# Patient Record
Sex: Male | Born: 1985 | Race: White | Hispanic: No | Marital: Married | Smoking: Never smoker
Health system: Southern US, Community
[De-identification: ages and names within clinical notes are randomized; demographics above are authoritative.]

## PROBLEM LIST (undated history)

## (undated) DIAGNOSIS — N200 Calculus of kidney: Secondary | ICD-10-CM

---

## 2006-05-17 ENCOUNTER — Emergency Department: Payer: Self-pay | Admitting: Emergency Medicine

## 2006-05-20 ENCOUNTER — Emergency Department: Payer: Self-pay | Admitting: Emergency Medicine

## 2006-05-30 ENCOUNTER — Inpatient Hospital Stay: Payer: Self-pay | Admitting: Urology

## 2006-05-30 ENCOUNTER — Ambulatory Visit: Payer: Self-pay | Admitting: Urology

## 2011-04-11 ENCOUNTER — Emergency Department: Payer: Self-pay | Admitting: Emergency Medicine

## 2011-05-24 ENCOUNTER — Emergency Department: Payer: Self-pay | Admitting: Unknown Physician Specialty

## 2015-06-01 ENCOUNTER — Emergency Department
Admission: EM | Admit: 2015-06-01 | Discharge: 2015-06-01 | Disposition: A | Discharge status: Home / Self Care | Payer: BLUE CROSS/BLUE SHIELD | Attending: Emergency Medicine | Admitting: Emergency Medicine

## 2015-06-01 ENCOUNTER — Emergency Department: Payer: BLUE CROSS/BLUE SHIELD

## 2015-06-01 DIAGNOSIS — R109 Unspecified abdominal pain: Secondary | ICD-10-CM | POA: Diagnosis not present

## 2015-06-01 DIAGNOSIS — Z87442 Personal history of urinary calculi: Secondary | ICD-10-CM | POA: Insufficient documentation

## 2015-06-01 HISTORY — DX: Calculus of kidney: N20.0

## 2015-06-01 LAB — COMPREHENSIVE METABOLIC PANEL
ALT: 22 U/L (ref 17–63)
AST: 22 U/L (ref 15–41)
Albumin: 4.8 g/dL (ref 3.5–5.0)
Alkaline Phosphatase: 46 U/L (ref 38–126)
Anion gap: 7 (ref 5–15)
BUN: 21 mg/dL — ABNORMAL HIGH (ref 6–20)
CALCIUM: 9.2 mg/dL (ref 8.9–10.3)
CO2: 28 mmol/L (ref 22–32)
Chloride: 103 mmol/L (ref 101–111)
Creatinine, Ser: 0.92 mg/dL (ref 0.61–1.24)
GFR calc Af Amer: >60 mL/min (ref >60)
GFR calc non Af Amer: >60 mL/min (ref >60)
Glucose, Bld: 135 mg/dL — ABNORMAL HIGH (ref 65–99)
POTASSIUM: 4.1 mmol/L (ref 3.5–5.1)
SODIUM: 138 mmol/L (ref 135–145)
Total Bilirubin: 1.5 mg/dL — ABNORMAL HIGH (ref 0.3–1.2)
Total Protein: 7.4 g/dL (ref 6.5–8.1)

## 2015-06-01 LAB — URINALYSIS COMPLETE WITH MICROSCOPIC (ARMC ONLY)
BACTERIA UA: NONE SEEN
Bilirubin Urine: NEGATIVE
GLUCOSE, UA: NEGATIVE mg/dL
Hgb urine dipstick: NEGATIVE
Ketones, ur: NEGATIVE mg/dL
Leukocytes, UA: NEGATIVE
Nitrite: NEGATIVE
PROTEIN: NEGATIVE mg/dL
Specific Gravity, Urine: 1.003 — ABNORMAL LOW (ref 1.005–1.030)
Squamous Epithelial / LPF: NONE SEEN
pH: 7 (ref 5.0–8.0)

## 2015-06-01 LAB — CBC
HCT: 43.3 % (ref 40.0–52.0)
Hemoglobin: 14.7 g/dL (ref 13.0–18.0)
MCH: 30.7 pg (ref 26.0–34.0)
MCHC: 34.1 g/dL (ref 32.0–36.0)
MCV: 90.1 fL (ref 80.0–100.0)
Platelets: 215 10*3/uL (ref 150–440)
RBC: 4.8 MIL/uL (ref 4.40–5.90)
RDW: 12.4 % (ref 11.5–14.5)
WBC: 5.4 10*3/uL (ref 3.8–10.6)

## 2015-06-01 MED ORDER — ONDANSETRON HCL 4 MG/2ML IJ SOLN
INTRAMUSCULAR | Status: AC
  Filled 2015-06-01: qty 2

## 2015-06-01 MED ORDER — KETOROLAC TROMETHAMINE 30 MG/ML IJ SOLN
30.0000 mg | Freq: Once | INTRAMUSCULAR | Status: AC
  Administered 2015-06-01: 30 mg via INTRAVENOUS

## 2015-06-01 MED ORDER — FENTANYL CITRATE (PF) 100 MCG/2ML IJ SOLN
75.0000 ug | Freq: Once | INTRAMUSCULAR | Status: AC
  Administered 2015-06-01: 75 ug via INTRAVENOUS

## 2015-06-01 MED ORDER — SODIUM CHLORIDE 0.9 % IV BOLUS (SEPSIS)
1000.0000 mL | Freq: Once | INTRAVENOUS | Status: AC
  Administered 2015-06-01: 1000 mL via INTRAVENOUS

## 2015-06-01 MED ORDER — FENTANYL CITRATE (PF) 100 MCG/2ML IJ SOLN
INTRAMUSCULAR | Status: AC
  Filled 2015-06-01: qty 2

## 2015-06-01 MED ORDER — KETOROLAC TROMETHAMINE 30 MG/ML IJ SOLN
INTRAMUSCULAR | Status: AC
  Filled 2015-06-01: qty 1

## 2015-06-01 MED ORDER — ONDANSETRON HCL 4 MG/2ML IJ SOLN
4.0000 mg | Freq: Once | INTRAMUSCULAR | Status: AC
  Administered 2015-06-01: 4 mg via INTRAVENOUS

## 2015-06-01 NOTE — ED Provider Notes (Signed)
[facility] Emergency Department Provider Note  Time seen: 9:04 AM  I have reviewed the triage vital signs and the nursing notes.   HISTORY  Chief Complaint Flank Pain    HPI Javier Stevens is a [age] y.o. male with a past medical history of kidney stones presents the emergency department with right flank pain times one day. According to the patient he awoke with right-sided flank pain which is gradually worsened throughout the day. He also states nausea with vomiting. He has had 4 kidney stones in the past, one of which were required an intervention to remove. Patient has seen Dr. Artis FlockWolfe and Dr. Lonna CobbStoioff in the past. Patient denies any hematuria or dysuria. The pain is on his right flank, severe in severity, sharp, associated with nausea and vomiting.    Past Medical History  Diagnosis Date  . Kidney stones     There are no active problems to display for this patient.   History reviewed. No pertinent past surgical history.  No current outpatient prescriptions on file.  Allergies Morphine and related  No family history on file.  Social History History  Substance Use Topics  . Smoking status: Never Smoker   . Smokeless tobacco: Not on file  . Alcohol Use: No    Review of Systems Constitutional: Negative for fever. Cardiovascular: Negative for chest pain. Respiratory: Negative for shortness of breath. Gastrointestinal: Positive for right flank pain. Negative for diarrhea. Positive for nausea and vomiting. Genitourinary: Negative for dysuria. Negative for hematuria. Musculoskeletal: Positive for right back pain. Skin: Negative for rash. Neurological: Negative for headache 10-point ROS otherwise negative.  ____________________________________________   PHYSICAL EXAM:  VITAL SIGNS: ED Triage Vitals  Enc Vitals Group     BP 06/01/15 0839 145/80 mmHg     Pulse Rate 06/01/15 0839 63     Resp 06/01/15 0839 18     Temp 06/01/15 0839 97.6  F (36.4 C)     Temp Source 06/01/15 0839 Oral     SpO2 06/01/15 0839 99 %     Weight 06/01/15 0839 190 lb (86.183 kg)     Height 06/01/15 0839 6\' 2"  (1.88 m)     Head Cir --      Peak Flow --      Pain Score 06/01/15 0843 5     Pain Loc --      Pain Edu? --      Excl. in GC? --     Constitutional: Alert and oriented. Well appearing and in no distress. ENT   Head: Normocephalic and atraumatic.   Mouth/Throat: Mucous membranes are moist. Cardiovascular: Normal rate, regular rhythm. No murmurs Respiratory: Normal respiratory effort without tachypnea nor retractions. Breath sounds are clear Gastrointestinal: Soft and nontender. No distention.  No CVA tenderness palpation. Musculoskeletal: Nontender with normal range of motion in all extremities.  Neurologic:  Normal speech and language. No gross focal neurologic deficits  Skin:  Skin is warm, dry and intact.  Psychiatric: Mood and affect are normal. Speech and behavior are normal.  ____________________________________________   RADIOLOGY  CT within normal limits.  ____________________________________________   INITIAL IMPRESSION / ASSESSMENT AND PLAN / ED COURSE  Pertinent labs & imaging results that were available during my care of the patient were reviewed by me and considered in my medical decision making (see chart for details).  29 year old male with right flank pain starting this morning. He states it feels like his past kidney stones. We will treat as such,  while awaiting lab results. The patient does have a history of requiring an intervention for one of his stones, we will obtain a CT scan to help locate the stone, and help delineate the size of the stone.  CT scan within normal limits, labs largely within normal limits as well. I discussed with the patient who states the pain is completely resolved at this time. We will discharge home with primary care follow-up. I discussed strict return precautions which the  patient is agreeable.  ____________________________________________   FINAL CLINICAL IMPRESSION(S) / ED DIAGNOSES  Right flank pain   Minna Antis, MD 06/01/15 1112

## 2015-06-01 NOTE — Discharge Instructions (Signed)

## 2015-06-01 NOTE — ED Notes (Signed)
Right sided flank pain that began this AM. Intermittent pain. Hx of kidney stones. Pt alert and oriented X4, active, cooperative, pt in NAD. RR even and unlabored, color WNL.

## 2015-06-01 NOTE — ED Notes (Signed)
Discharge instructions given and reviewed with patient.  Patient and spouse verbalized understanding to return to ED for worsening symptoms or to follow up with PMD.  Patient ambulatory; discharged home in good condition.

## 2015-10-11 DIAGNOSIS — F329 Major depressive disorder, single episode, unspecified: Secondary | ICD-10-CM

## 2015-10-11 DIAGNOSIS — F32A Depression, unspecified: Secondary | ICD-10-CM

## 2015-10-12 ENCOUNTER — Ambulatory Visit (INDEPENDENT_AMBULATORY_CARE_PROVIDER_SITE_OTHER): Payer: BLUE CROSS/BLUE SHIELD | Admitting: Unknown Physician Specialty

## 2015-10-12 ENCOUNTER — Encounter: Payer: Self-pay | Admitting: Unknown Physician Specialty

## 2015-10-12 VITALS — BP 152/83 | HR 64 | Temp 98.6°F | Ht 72.2 in | Wt 197.8 lb

## 2015-10-12 DIAGNOSIS — Z0000 Encounter for general adult medical examination without abnormal findings: Secondary | ICD-10-CM | POA: Diagnosis not present

## 2015-10-12 DIAGNOSIS — I10 Essential (primary) hypertension: Secondary | ICD-10-CM | POA: Diagnosis not present

## 2015-10-12 LAB — MICROALBUMIN, URINE WAIVED
CREATININE, URINE WAIVED: 100 mg/dL (ref 10–300)
Microalb, Ur Waived: 10 mg/L (ref 0–19)

## 2015-10-12 MED ORDER — LISINOPRIL 5 MG PO TABS: 5.0000 mg | ORAL_TABLET | Freq: Every day | ORAL | Status: DC

## 2015-10-12 NOTE — Assessment & Plan Note (Signed)
Very high BP for a healthy man his age.  Get appropriate labs.  Start Lisinopril 5 mg.

## 2015-10-12 NOTE — Patient Instructions (Signed)
DASH Eating Plan  DASH stands for "Dietary Approaches to Stop Hypertension." The DASH eating plan is a healthy eating plan that has been shown to reduce high blood pressure (hypertension). Additional health benefits may include reducing the risk of type 2 diabetes mellitus, heart disease, and stroke. The DASH eating plan may also help with weight loss.  WHAT DO I NEED TO KNOW ABOUT THE DASH EATING PLAN?  For the DASH eating plan, you will follow these general guidelines:  · Choose foods with a percent daily value for sodium of less than 5% (as listed on the food label).  · Use salt-free seasonings or herbs instead of table salt or sea salt.  · Check with your health care provider or pharmacist before using salt substitutes.  · Eat lower-sodium products, often labeled as "lower sodium" or "no salt added."  · Eat fresh foods.  · Eat more vegetables, fruits, and low-fat dairy products.  · Choose whole grains. Look for the word "whole" as the first word in the ingredient list.  · Choose fish and skinless chicken or turkey more often than red meat. Limit fish, poultry, and meat to 6 oz (170 g) each day.  · Limit sweets, desserts, sugars, and sugary drinks.  · Choose heart-healthy fats.  · Limit cheese to 1 oz (28 g) per day.  · Eat more home-cooked food and less restaurant, buffet, and fast food.  · Limit fried foods.  · Cook foods using methods other than frying.  · Limit canned vegetables. If you do use them, rinse them well to decrease the sodium.  · When eating at a restaurant, ask that your food be prepared with less salt, or no salt if possible.  WHAT FOODS CAN I EAT?  Seek help from a dietitian for individual calorie needs.  Grains  Whole grain or whole wheat bread. Brown rice. Whole grain or whole wheat pasta. Quinoa, bulgur, and whole grain cereals. Low-sodium cereals. Corn or whole wheat flour tortillas. Whole grain cornbread. Whole grain crackers. Low-sodium crackers.  Vegetables  Fresh or frozen vegetables  (raw, steamed, roasted, or grilled). Low-sodium or reduced-sodium tomato and vegetable juices. Low-sodium or reduced-sodium tomato sauce and paste. Low-sodium or reduced-sodium canned vegetables.   Fruits  All fresh, canned (in natural juice), or frozen fruits.  Meat and Other Protein Products  Ground beef (85% or leaner), grass-fed beef, or beef trimmed of fat. Skinless chicken or turkey. Ground chicken or turkey. Pork trimmed of fat. All fish and seafood. Eggs. Dried beans, peas, or lentils. Unsalted nuts and seeds. Unsalted canned beans.  Dairy  Low-fat dairy products, such as skim or 1% milk, 2% or reduced-fat cheeses, low-fat ricotta or cottage cheese, or plain low-fat yogurt. Low-sodium or reduced-sodium cheeses.  Fats and Oils  Tub margarines without trans fats. Light or reduced-fat mayonnaise and salad dressings (reduced sodium). Avocado. Safflower, olive, or canola oils. Natural peanut or almond butter.  Other  Unsalted popcorn and pretzels.  The items listed above may not be a complete list of recommended foods or beverages. Contact your dietitian for more options.  WHAT FOODS ARE NOT RECOMMENDED?  Grains  White bread. White pasta. White rice. Refined cornbread. Bagels and croissants. Crackers that contain trans fat.  Vegetables  Creamed or fried vegetables. Vegetables in a cheese sauce. Regular canned vegetables. Regular canned tomato sauce and paste. Regular tomato and vegetable juices.  Fruits  Dried fruits. Canned fruit in light or heavy syrup. Fruit juice.  Meat and Other Protein   Products  Fatty cuts of meat. Ribs, chicken wings, bacon, sausage, bologna, salami, chitterlings, fatback, hot dogs, bratwurst, and packaged luncheon meats. Salted nuts and seeds. Canned beans with salt.  Dairy  Whole or 2% milk, cream, half-and-half, and cream cheese. Whole-fat or sweetened yogurt. Full-fat cheeses or blue cheese. Nondairy creamers and whipped toppings. Processed cheese, cheese spreads, or cheese  curds.  Condiments  Onion and garlic salt, seasoned salt, table salt, and sea salt. Canned and packaged gravies. Worcestershire sauce. Tartar sauce. Barbecue sauce. Teriyaki sauce. Soy sauce, including reduced sodium. Steak sauce. Fish sauce. Oyster sauce. Cocktail sauce. Horseradish. Ketchup and mustard. Meat flavorings and tenderizers. Bouillon cubes. Hot sauce. Tabasco sauce. Marinades. Taco seasonings. Relishes.  Fats and Oils  Butter, stick margarine, lard, shortening, ghee, and bacon fat. Coconut, palm kernel, or palm oils. Regular salad dressings.  Other  Pickles and olives. Salted popcorn and pretzels.  The items listed above may not be a complete list of foods and beverages to avoid. Contact your dietitian for more information.  WHERE CAN I FIND MORE INFORMATION?  National Heart, Lung, and Blood Institute: www.nhlbi.nih.gov/health/health-topics/topics/dash/     This information is not intended to replace advice given to you by your health care provider. Make sure you discuss any questions you have with your health care provider.     Document Released: 11/30/2011 Document Revised: 01/01/2015 Document Reviewed: 10/15/2013  Elsevier Interactive Patient Education ©2016 Elsevier Inc.

## 2015-10-12 NOTE — Progress Notes (Signed)
BP 152/83 mmHg  Pulse 64  Temp(Src) 98.6 F (37 C)  Ht 6' 0.2" (1.834 m)  Wt 197 lb 12.8 oz (89.721 kg)  BMI 26.67 kg/m2  SpO2 98%   Subjective:    Patient ID: Javier Stevens, male    DOB: October 13, 1986, 29 y.o.   MRN: [id number]  HPI: Javier Stevens is a 29 y.o. male  Chief Complaint  Patient presents with  . Establish Care   And to be seen for physical.    Relevant past medical, surgical, family and social history reviewed and updated as indicated. Interim medical history since our last visit reviewed. Allergies and medications reviewed and updated.  Had an episode of kidney stones and went to the ER June 2016.  Passed stones without problems.    Depression screen PHQ 2/9 10/12/2015  Decreased Interest 0  Down, Depressed, Hopeless 0  PHQ - 2 Score 0      Review of Systems  Constitutional: Negative.   HENT: Negative.   Eyes: Negative.   Respiratory: Negative.   Cardiovascular: Negative.   Gastrointestinal: Negative.   Endocrine: Negative.   Genitourinary: Negative.   Skin: Negative.   Allergic/Immunologic: Negative.   Neurological: Negative.   Hematological: Negative.   Psychiatric/Behavioral: Negative.     Per HPI unless specifically indicated above     Objective:    BP 152/83 mmHg  Pulse 64  Temp(Src) 98.6 F (37 C)  Ht 6' 0.2" (1.834 m)  Wt 197 lb 12.8 oz (89.721 kg)  BMI 26.67 kg/m2  SpO2 98%  Wt Readings from Last 3 Encounters:  10/12/15 197 lb 12.8 oz (89.721 kg)  04/11/12 190 lb (86.183 kg)  06/01/15 190 lb (86.183 kg)    Physical Exam  Constitutional: He is oriented to person, place, and time. He appears well-developed and well-nourished.  HENT:  Head: Normocephalic.  Eyes: Pupils are equal, round, and reactive to light.  Cardiovascular: Normal rate, regular rhythm and normal heart sounds.   Pulmonary/Chest: Effort normal.  Abdominal: Soft. Bowel sounds are normal.  Musculoskeletal: Normal range of motion.   Neurological: He is alert and oriented to person, place, and time. He has normal reflexes.  Skin: Skin is warm and dry.  Psychiatric: He has a normal mood and affect. His behavior is normal. Judgment and thought content normal.    Results for orders placed or performed during the hospital encounter of 06/01/15  Urinalysis complete, with microscopic- may I&O cath if menses [facility] only)  Result Value Ref Range   Color, Urine STRAW (A) YELLOW   APPearance CLEAR (A) CLEAR   Glucose, UA NEGATIVE NEGATIVE mg/dL   Bilirubin Urine NEGATIVE NEGATIVE   Ketones, ur NEGATIVE NEGATIVE mg/dL   Specific Gravity, Urine 1.003 (L) 1.005 - 1.030   Hgb urine dipstick NEGATIVE NEGATIVE   pH 7.0 5.0 - 8.0   Protein, ur NEGATIVE NEGATIVE mg/dL   Nitrite NEGATIVE NEGATIVE   Leukocytes, UA NEGATIVE NEGATIVE   RBC / HPF 0-5 0 - 5 RBC/hpf   WBC, UA 0-5 0 - 5 WBC/hpf   Bacteria, UA NONE SEEN NONE SEEN   Squamous Epithelial / LPF NONE SEEN NONE SEEN  CBC (if pt has a temp above 100.25F)  Result Value Ref Range   WBC 5.4 3.8 - 10.6 K/uL   RBC 4.80 4.40 - 5.90 MIL/uL   Hemoglobin 14.7 13.0 - 18.0 g/dL   HCT 02.7 25.3 - 66.4 %   MCV 90.1 80.0 - 100.0 fL  MCH 30.7 26.0 - 34.0 pg   MCHC 34.1 32.0 - 36.0 g/dL   RDW 12.4 09.611.5 - 16.104.514.5 %   Platelets 215 150 - 440 K/uL  Comprehensive metabolic panel (if pt has a temp above 100.81F)  Result Value Ref Range   Sodium 138 135 - 145 mmol/L   Potassium 4.1 3.5 - 5.1 mmol/L   Chloride 103 101 - 111 mmol/L   CO2 28 22 - 32 mmol/L   Glucose, Bld 135 (H) 65 - 99 mg/dL   BUN 21 (H) 6 - 20 mg/dL   Creatinine, Ser 4.090.92 0.61 - 1.24 mg/dL   Calcium 9.2 8.9 - 81.110.3 mg/dL   Total Protein 7.4 6.5 - 8.1 g/dL   Albumin 4.8 3.5 - 5.0 g/dL   AST 22 15 - 41 U/L   ALT 22 17 - 63 U/L   Alkaline Phosphatase 46 38 - 126 U/L   Total Bilirubin 1.5 (H) 0.3 - 1.2 mg/dL   GFR calc non Af Amer >60 >60 mL/min   GFR calc Af Amer >60 >60 mL/min   Anion gap 7 5 - 15      Assessment &  Plan:   Problem List Items Addressed This Visit      Unprioritized   Hypertension    Very high BP for a healthy man his age.  Get appropriate labs.  Start Lisinopril 5 mg.        Relevant Medications   lisinopril (PRINIVIL,ZESTRIL) 5 MG tablet   Other Relevant Orders   CBC with Differential/Platelet   Comprehensive metabolic panel   Lipid Panel w/o Chol/HDL Ratio   Uric acid   Microalbumin, Urine Waived    Other Visit Diagnoses    Annual physical exam    -  Primary    Relevant Orders    HIV antibody    TSH        Follow up plan: Return in about 4 weeks (around 11/09/2015).

## 2015-10-13 ENCOUNTER — Encounter: Payer: Self-pay | Admitting: Unknown Physician Specialty

## 2015-10-13 LAB — COMPREHENSIVE METABOLIC PANEL
ALBUMIN: 4.6 g/dL (ref 3.5–5.5)
ALT: 23 IU/L (ref 0–44)
AST: 18 IU/L (ref 0–40)
Albumin/Globulin Ratio: 2 (ref 1.1–2.5)
Alkaline Phosphatase: 46 IU/L (ref 39–117)
BUN / CREAT RATIO: 16 (ref 8–19)
BUN: 15 mg/dL (ref 6–20)
Bilirubin Total: 1 mg/dL (ref 0.0–1.2)
CALCIUM: 9.7 mg/dL (ref 8.7–10.2)
CHLORIDE: 101 mmol/L (ref 97–106)
CO2: 26 mmol/L (ref 18–29)
CREATININE: 0.96 mg/dL (ref 0.76–1.27)
GFR calc non Af Amer: 106 mL/min/{1.73_m2} (ref >59)
GFR, EST AFRICAN AMERICAN: 122 mL/min/{1.73_m2} (ref >59)
GLUCOSE: 83 mg/dL (ref 65–99)
Globulin, Total: 2.3 g/dL (ref 1.5–4.5)
Potassium: 4 mmol/L (ref 3.5–5.2)
Sodium: 142 mmol/L (ref 136–144)
TOTAL PROTEIN: 6.9 g/dL (ref 6.0–8.5)

## 2015-10-13 LAB — LIPID PANEL W/O CHOL/HDL RATIO
Cholesterol, Total: 118 mg/dL (ref 100–199)
HDL: 41 mg/dL (ref >39)
LDL Calculated: 53 mg/dL (ref 0–99)
TRIGLYCERIDES: 121 mg/dL (ref 0–149)
VLDL CHOLESTEROL CAL: 24 mg/dL (ref 5–40)

## 2015-10-13 LAB — CBC WITH DIFFERENTIAL/PLATELET
BASOS ABS: 0 10*3/uL (ref 0.0–0.2)
Basos: 0 %
EOS (ABSOLUTE): 0.1 10*3/uL (ref 0.0–0.4)
Eos: 2 %
HEMATOCRIT: 42.4 % (ref 37.5–51.0)
Hemoglobin: 14.5 g/dL (ref 12.6–17.7)
Immature Grans (Abs): 0 10*3/uL (ref 0.0–0.1)
Immature Granulocytes: 0 %
Lymphocytes Absolute: 2.3 10*3/uL (ref 0.7–3.1)
Lymphs: 37 %
MCH: 30.7 pg (ref 26.6–33.0)
MCHC: 34.2 g/dL (ref 31.5–35.7)
MCV: 90 fL (ref 79–97)
MONOS ABS: 0.4 10*3/uL (ref 0.1–0.9)
Monocytes: 7 %
NEUTROS ABS: 3.2 10*3/uL (ref 1.4–7.0)
Neutrophils: 54 %
Platelets: 249 10*3/uL (ref 150–379)
RBC: 4.72 x10E6/uL (ref 4.14–5.80)
RDW: 13.1 % (ref 12.3–15.4)
WBC: 6 10*3/uL (ref 3.4–10.8)

## 2015-10-13 LAB — URIC ACID: Uric Acid: 6.1 mg/dL (ref 3.7–8.6)

## 2015-10-13 LAB — HIV ANTIBODY (ROUTINE TESTING W REFLEX): HIV Screen 4th Generation wRfx: NONREACTIVE

## 2015-10-13 LAB — TSH: TSH: 0.628 u[IU]/mL (ref 0.450–4.500)

## 2015-11-10 ENCOUNTER — Ambulatory Visit: Payer: BLUE CROSS/BLUE SHIELD | Admitting: Unknown Physician Specialty

## 2017-02-05 IMAGING — CT CT RENAL STONE PROTOCOL
1 of 2 series · 16 of 32 positions shown, 20 images · non-contrast
Comparison: 04/11/2011

CLINICAL DATA: Right-sided flank pain, known history of renal
calculi

EXAM:
CT ABDOMEN AND PELVIS WITHOUT CONTRAST
TECHNIQUE: Multidetector CT imaging of the abdomen and pelvis was performed
following the standard protocol without IV contrast.

[Series 2: stone standard full · axial · 0.68mm/px · z∈[-640,-200]mm · 16 of 98 slices shown, 20 images]
[im 5/98  soft-tissue]
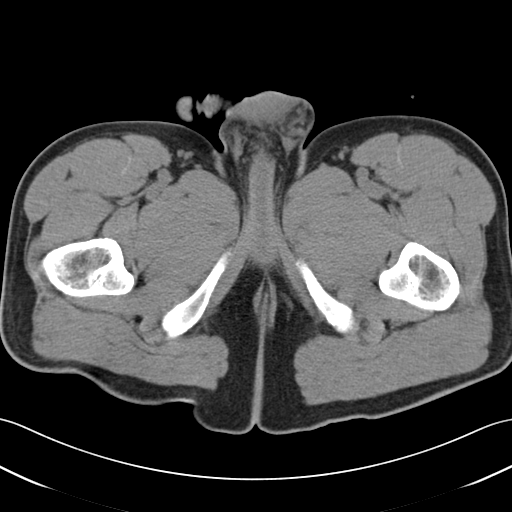
[im 5/98  bone]
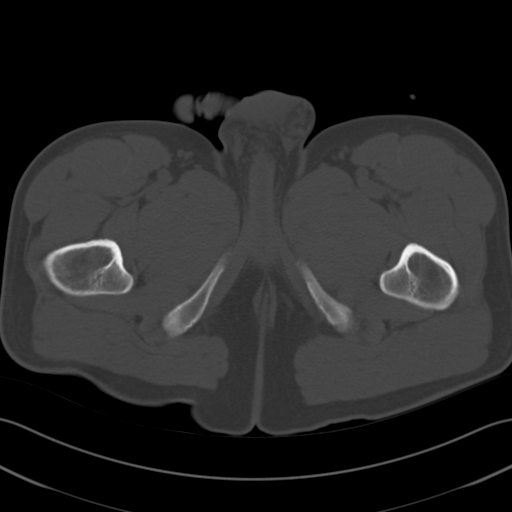
[im 13/98  soft-tissue]
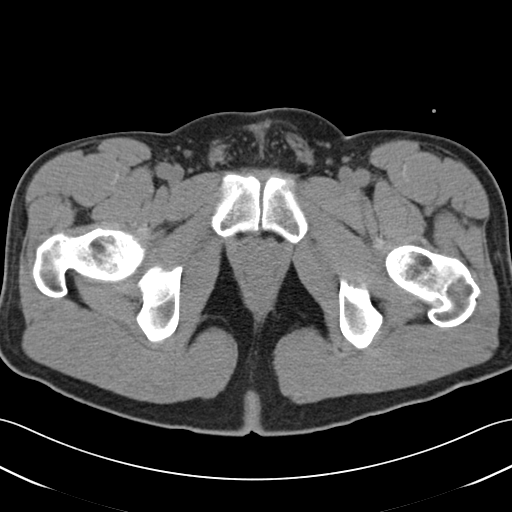
[im 21/98  soft-tissue]
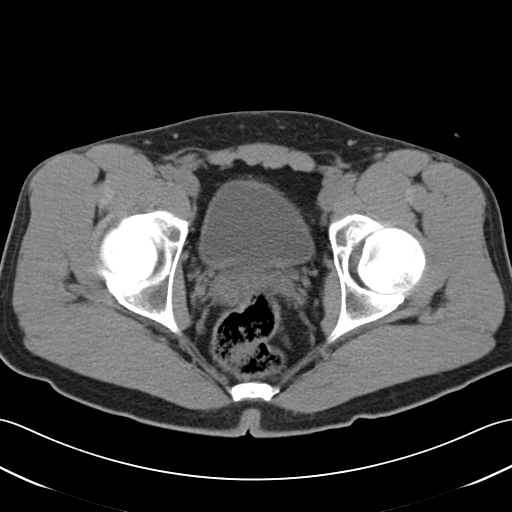
[im 25/98  soft-tissue]
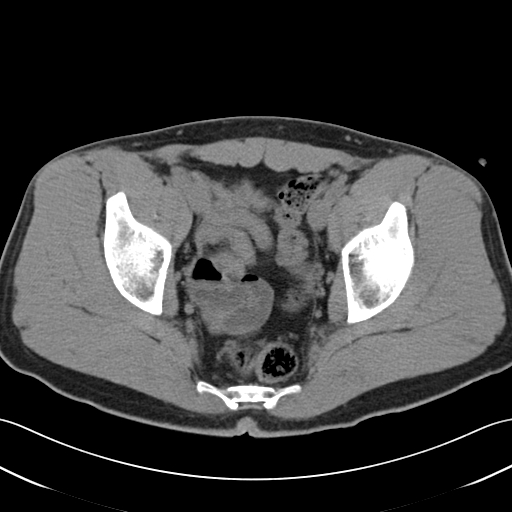
[im 33/98  soft-tissue]
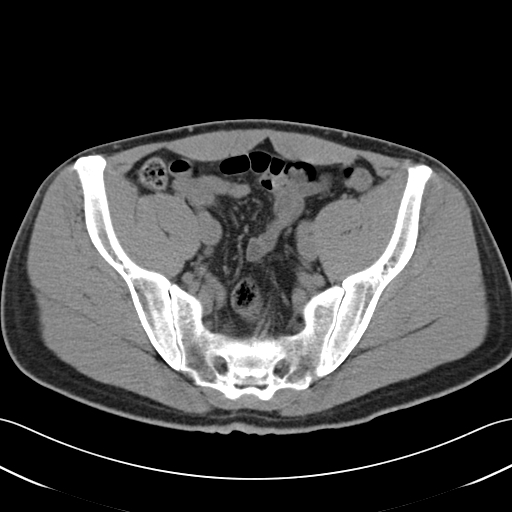
[im 41/98  soft-tissue]
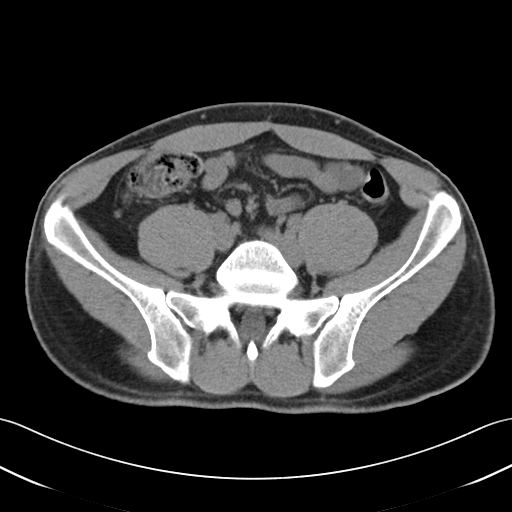
[im 45/98  soft-tissue]
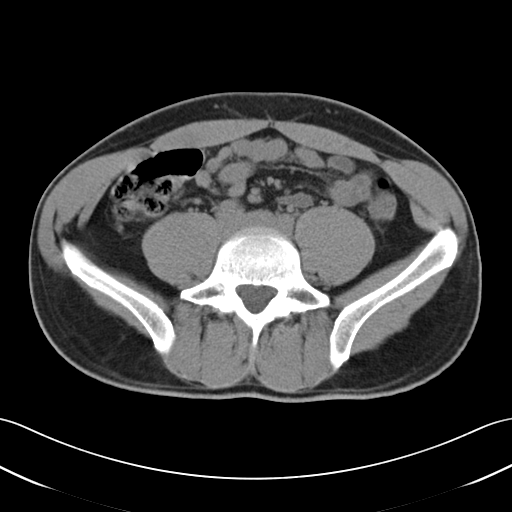
[im 53/98  soft-tissue]
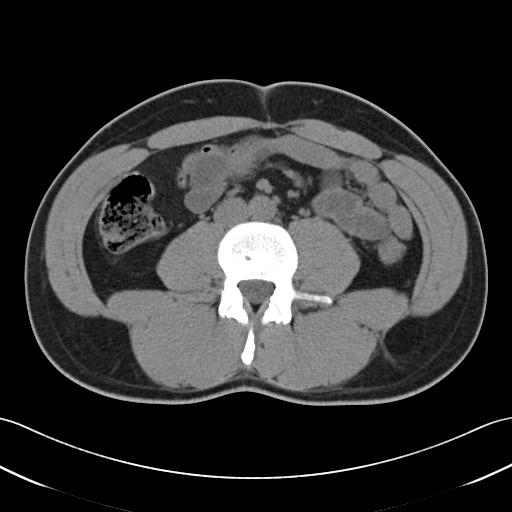
[im 57/98  soft-tissue]
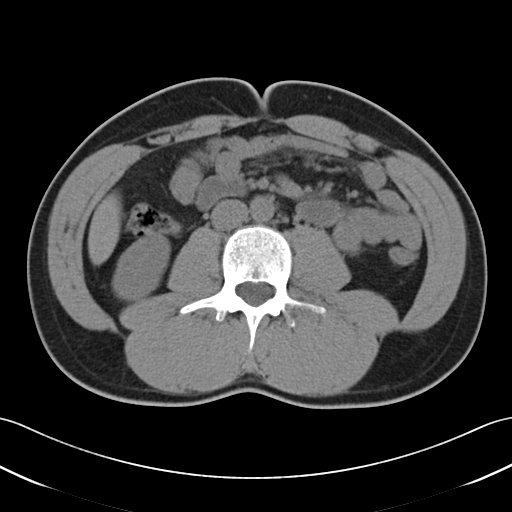
[im 57/98  bone]
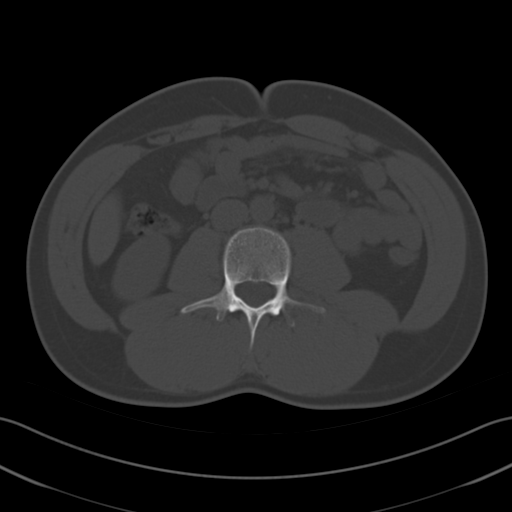
[im 65/98  soft-tissue]
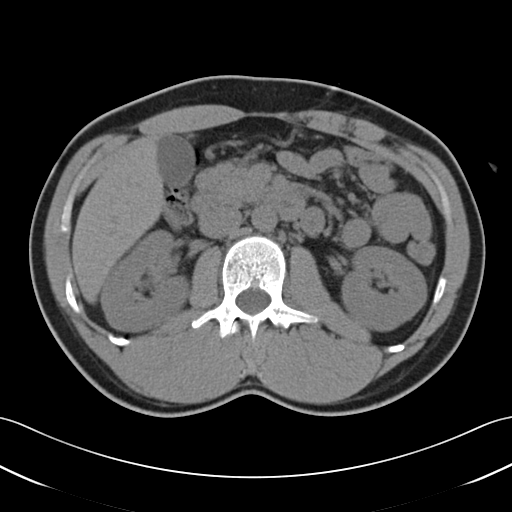
[im 73/98  soft-tissue]
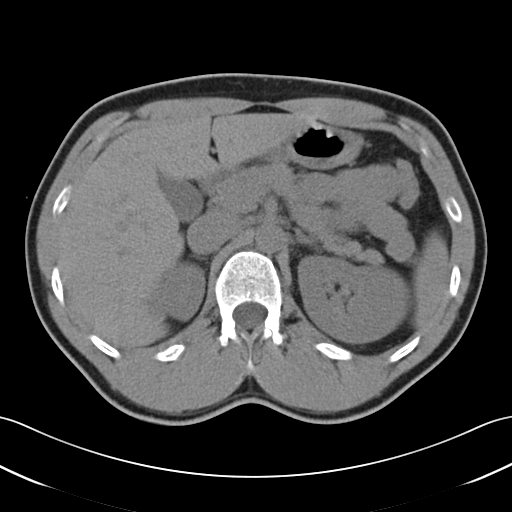
[im 77/98  soft-tissue]
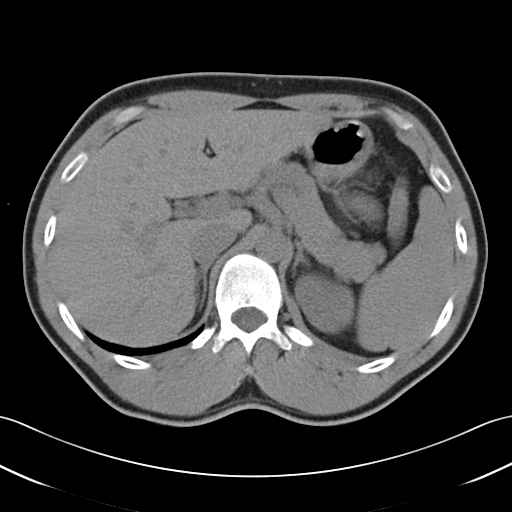
[im 81/98  lung]
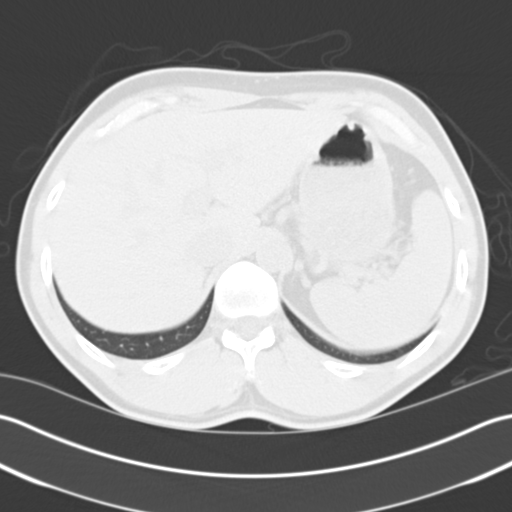
[im 85/98  soft-tissue]
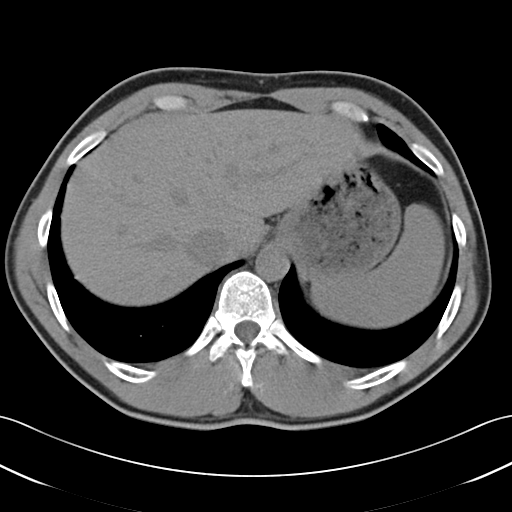
[im 85/98  lung]
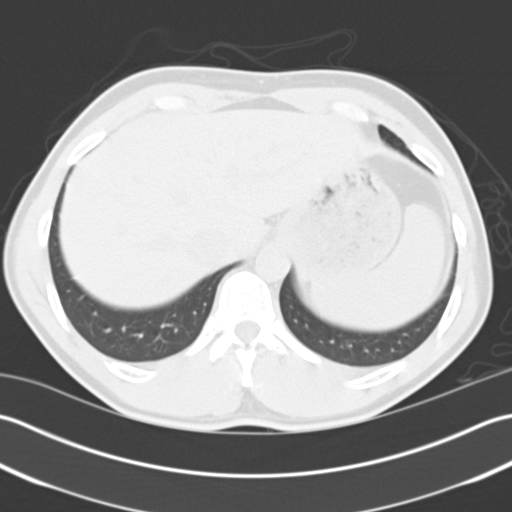
[im 89/98  lung]
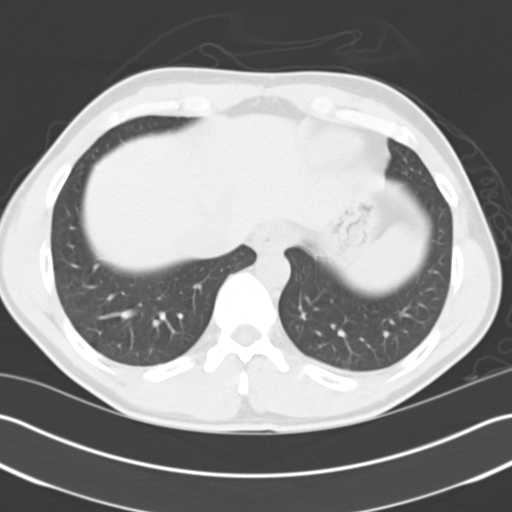
[im 93/98  soft-tissue]
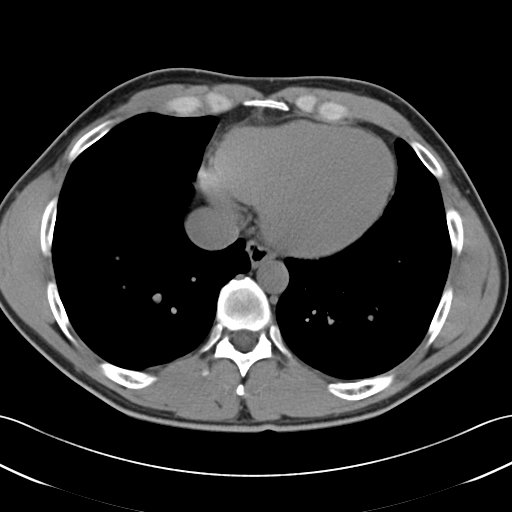
[im 93/98  lung]
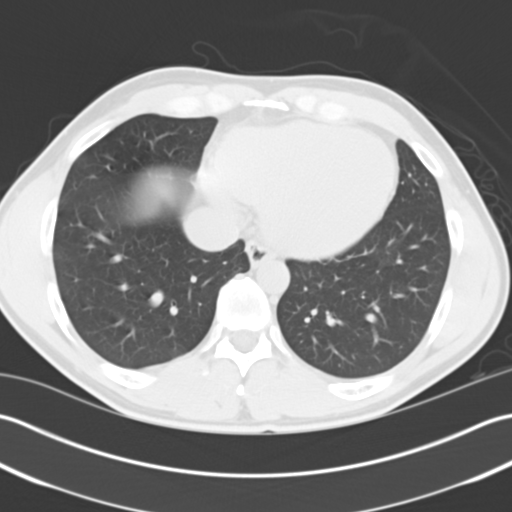

[16 of 32 positions shown; findings below may reference images not displayed]

FINDINGS: Lung bases are free of acute infiltrate or sizable effusion.

The liver, gallbladder, spleen, adrenal glands and pancreas are
within normal limits. The kidneys are well visualized bilaterally
without evidence of renal calculi or obstructive changes.

The appendix is well visualized and within normal limits. No pelvic
mass lesion is seen. The bladder is well distended. No acute bony
abnormality is noted.
IMPRESSION: No acute abnormality seen.

## 2017-04-02 ENCOUNTER — Ambulatory Visit (INDEPENDENT_AMBULATORY_CARE_PROVIDER_SITE_OTHER): Payer: BLUE CROSS/BLUE SHIELD | Admitting: Family Medicine

## 2017-04-02 ENCOUNTER — Encounter: Payer: Self-pay | Admitting: Family Medicine

## 2017-04-02 VITALS — BP 132/72 | HR 67 | Temp 98.0°F | Resp 17 | Ht 72.2 in | Wt 207.0 lb

## 2017-04-02 DIAGNOSIS — K4090 Unilateral inguinal hernia, without obstruction or gangrene, not specified as recurrent: Secondary | ICD-10-CM

## 2017-04-02 NOTE — Patient Instructions (Addendum)
Hernia A hernia happens when an organ or tissue inside your body pushes out through a weak spot in the belly (abdomen). Follow these instructions at home:  Avoid stretching or overusing (straining) the muscles near the hernia.  Do not lift anything heavier than 10 lb (4.5 kg).  Use the muscles in your leg when you lift something up. Do not use the muscles in your back.  When you cough, try to cough gently.  Eat a diet that has a lot of fiber. Eat lots of fruits and vegetables.  Drink enough fluids to keep your pee (urine) clear or pale yellow. Try to drink 6-8 glasses of water a day.  Take medicines to make your poop soft (stool softeners) as told by your doctor.  Lose weight, if you are overweight.  Do not use any tobacco products, including cigarettes, chewing tobacco, or electronic cigarettes. If you need help quitting, ask your doctor.  Keep all follow-up visits as told by your doctor. This is important. Contact a doctor if:  The skin by the hernia gets puffy (swollen) or red.  The hernia is painful. Get help right away if:  You have a fever.  You have belly pain that is getting worse.  You feel sick to your stomach (nauseous) or you throw up (vomit).  You cannot push the hernia back in place by gently pressing on it while you are lying down.  The hernia:  Changes in shape or size.  Is stuck outside your belly.  Changes color.  Feels hard or tender. This information is not intended to replace advice given to you by your health care provider. Make sure you discuss any questions you have with your health care provider. Document Released: 05/31/2010 Document Revised: 05/18/2016 Document Reviewed: 10/21/2014 Elsevier Interactive Patient Education  2017 Elsevier Inc.  

## 2017-04-02 NOTE — Progress Notes (Signed)
BP 132/72   Pulse 67   Temp 98 F (36.7 C) (Oral)   Resp 17   Ht 6' 0.2" (1.834 m)   Wt 207 lb (93.9 kg)   SpO2 100%   BMI 27.92 kg/m    Subjective:    Patient ID: Javier Stevens, male    DOB: [birth year], 31 y.o.   MRN: [id number]  HPI: Javier Stevens is a 31 y.o. male  Chief Complaint  Patient presents with  . Groin Pain    Bulge onset 2-3 weeks   LUMP IN GROIN Duration: 2-3 weekd  Location: right groin Painful: no Discomfort: yes Bulge: yes Quality:  discomfort Onset: sudden Severity: mild Context: bigger Aggravating factors: lifting and coughing  Relevant past medical, surgical, family and social history reviewed and updated as indicated. Interim medical history since our last visit reviewed. Allergies and medications reviewed and updated.  Review of Systems  Constitutional: Negative.   Respiratory: Negative.   Cardiovascular: Negative.   Genitourinary: Negative.   Psychiatric/Behavioral: Negative.     Per HPI unless specifically indicated above     Objective:    BP 132/72   Pulse 67   Temp 98 F (36.7 C) (Oral)   Resp 17   Ht 6' 0.2" (1.834 m)   Wt 207 lb (93.9 kg)   SpO2 100%   BMI 27.92 kg/m   Wt Readings from Last 3 Encounters:  04/02/17 207 lb (93.9 kg)  10/12/15 197 lb 12.8 oz (89.7 kg)  04/11/12 190 lb (86.2 kg)    Physical Exam  Constitutional: He is oriented to person, place, and time. He appears well-developed and well-nourished. No distress.  HENT:  Head: Normocephalic and atraumatic.  Right Ear: Hearing normal.  Left Ear: Hearing normal.  Nose: Nose normal.  Eyes: Conjunctivae and lids are normal. Right eye exhibits no discharge. Left eye exhibits no discharge. No scleral icterus.  Pulmonary/Chest: Effort normal. No respiratory distress.  Abdominal: A hernia is present. Hernia confirmed positive in the right inguinal area. Hernia confirmed negative in the left inguinal area.  Genitourinary: Testes normal and  penis normal. Cremasteric reflex is present. Circumcised. No penile tenderness.  Musculoskeletal: Normal range of motion.  Lymphadenopathy:       Right: No inguinal adenopathy present.       Left: No inguinal adenopathy present.  Neurological: He is alert and oriented to person, place, and time.  Skin: Skin is warm, dry and intact. No rash noted. No erythema. No pallor.  Psychiatric: He has a normal mood and affect. His speech is normal and behavior is normal. Judgment and thought content normal. Cognition and memory are normal.  Nursing note and vitals reviewed.   Results for orders placed or performed in visit on 10/12/15  CBC with Differential/Platelet  Result Value Ref Range   WBC 6.0 3.4 - 10.8 x10E3/uL   RBC 4.72 4.14 - 5.80 x10E6/uL   Hemoglobin 14.5 12.6 - 17.7 g/dL   Hematocrit 14.7 82.9 - 51.0 %   MCV 90 79 - 97 fL   MCH 30.7 26.6 - 33.0 pg   MCHC 34.2 31.5 - 35.7 g/dL   RDW 56.2 13.0 - 86.5 %   Platelets 249 150 - 379 x10E3/uL   Neutrophils 54 %   Lymphs 37 %   Monocytes 7 %   Eos 2 %   Basos 0 %   Neutrophils Absolute 3.2 1.4 - 7.0 x10E3/uL   Lymphocytes Absolute 2.3 0.7 - 3.1 x10E3/uL  Monocytes Absolute 0.4 0.1 - 0.9 x10E3/uL   EOS (ABSOLUTE) 0.1 0.0 - 0.4 x10E3/uL   Basophils Absolute 0.0 0.0 - 0.2 x10E3/uL   Immature Granulocytes 0 %   Immature Grans (Abs) 0.0 0.0 - 0.1 x10E3/uL  Comprehensive metabolic panel  Result Value Ref Range   Glucose 83 65 - 99 mg/dL   BUN 15 6 - 20 mg/dL   Creatinine, Ser 1.61 0.76 - 1.27 mg/dL   GFR calc non Af Amer 106 >59 mL/min/1.73   GFR calc Af Amer 122 >59 mL/min/1.73   BUN/Creatinine Ratio 16 8 - 19   Sodium 142 136 - 144 mmol/L   Potassium 4.0 3.5 - 5.2 mmol/L   Chloride 101 97 - 106 mmol/L   CO2 26 18 - 29 mmol/L   Calcium 9.7 8.7 - 10.2 mg/dL   Total Protein 6.9 6.0 - 8.5 g/dL   Albumin 4.6 3.5 - 5.5 g/dL   Globulin, Total 2.3 1.5 - 4.5 g/dL   Albumin/Globulin Ratio 2.0 1.1 - 2.5   Bilirubin Total 1.0 0.0 - 1.2  mg/dL   Alkaline Phosphatase 46 39 - 117 IU/L   AST 18 0 - 40 IU/L   ALT 23 0 - 44 IU/L  HIV antibody  Result Value Ref Range   HIV Screen 4th Generation wRfx Non Reactive Non Reactive  Lipid Panel w/o Chol/HDL Ratio  Result Value Ref Range   Cholesterol, Total 118 100 - 199 mg/dL   Triglycerides 096 0 - 149 mg/dL   HDL 41 >04 mg/dL   VLDL Cholesterol Cal 24 5 - 40 mg/dL   LDL Calculated 53 0 - 99 mg/dL  TSH  Result Value Ref Range   TSH 0.628 0.450 - 4.500 uIU/mL  Uric acid  Result Value Ref Range   Uric Acid 6.1 3.7 - 8.6 mg/dL  Microalbumin, Urine Waived  Result Value Ref Range   Microalb, Ur Waived 10 0 - 19 mg/L   Creatinine, Urine Waived 100 10 - 300 mg/dL   Microalb/Creat Ratio <30 <30 mg/g      Assessment & Plan:   Problem List Items Addressed This Visit    None    Visit Diagnoses    Right groin hernia    -  Primary   Will refer to general surgery. Call with any concerns. Information provided.    Relevant Orders   Ambulatory referral to General Surgery       Follow up plan: Return if symptoms worsen or fail to improve.

## 2017-04-03 ENCOUNTER — Telehealth: Payer: Self-pay | Admitting: Unknown Physician Specialty

## 2017-04-03 NOTE — Telephone Encounter (Signed)
Referral submitted to [facility] to [facility] where Dr. Kateri Mc is.  Patient can call and request appointment or see status of referral. Patient has BCBS.  Phone is [phone]

## 2017-04-03 NOTE — Telephone Encounter (Signed)
Routing to referral coordinator.

## 2017-04-03 NOTE — Telephone Encounter (Signed)
Called and let patient know about referral being redirected to [facility] to see Dr. Kateri Mc. I provided patient with the phone number so he could call to schedule or check on the status of his referral. I asked for the patient to give [place] a call if he had any questions or concerns.

## 2017-04-03 NOTE — Telephone Encounter (Signed)
Ms Clydie Braun (pts wife) stated that the pt was to be referred to [facility] but they would like to go see Gwenith Daily @ [facility].
